# Patient Record
Sex: Female | Born: 1950 | Race: White | Hispanic: No | Marital: Married | Smoking: Former smoker
Health system: Southern US, Community
[De-identification: ages and names within clinical notes are randomized; demographics above are authoritative.]

## PROBLEM LIST (undated history)

## (undated) DIAGNOSIS — I499 Cardiac arrhythmia, unspecified: Secondary | ICD-10-CM

## (undated) DIAGNOSIS — I471 Supraventricular tachycardia: Secondary | ICD-10-CM

## (undated) DIAGNOSIS — J349 Unspecified disorder of nose and nasal sinuses: Secondary | ICD-10-CM

## (undated) DIAGNOSIS — R000 Tachycardia, unspecified: Secondary | ICD-10-CM

## (undated) DIAGNOSIS — I341 Nonrheumatic mitral (valve) prolapse: Secondary | ICD-10-CM

## (undated) DIAGNOSIS — I5189 Other ill-defined heart diseases: Secondary | ICD-10-CM

## (undated) DIAGNOSIS — G8929 Other chronic pain: Secondary | ICD-10-CM

## (undated) DIAGNOSIS — I4719 Other supraventricular tachycardia: Secondary | ICD-10-CM

## (undated) DIAGNOSIS — R51 Headache: Secondary | ICD-10-CM

## (undated) DIAGNOSIS — I493 Ventricular premature depolarization: Secondary | ICD-10-CM

## (undated) DIAGNOSIS — E7800 Pure hypercholesterolemia, unspecified: Secondary | ICD-10-CM

## (undated) HISTORY — DX: Ventricular premature depolarization: I49.3

## (undated) HISTORY — DX: Tachycardia, unspecified: R00.0

## (undated) HISTORY — PX: CARDIAC CATHETERIZATION: SHX172

## (undated) HISTORY — DX: Other ill-defined heart diseases: I51.89

## (undated) HISTORY — DX: Nonrheumatic mitral (valve) prolapse: I34.1

## (undated) HISTORY — DX: Pure hypercholesterolemia, unspecified: E78.00

## (undated) HISTORY — DX: Headache: R51

## (undated) HISTORY — DX: Other chronic pain: G89.29

## (undated) HISTORY — PX: BREAST SURGERY: SHX581

## (undated) HISTORY — DX: Other supraventricular tachycardia: I47.19

## (undated) HISTORY — DX: Cardiac arrhythmia, unspecified: I49.9

## (undated) HISTORY — DX: Unspecified disorder of nose and nasal sinuses: J34.9

## (undated) HISTORY — PX: CHOLECYSTECTOMY: SHX55

## (undated) HISTORY — DX: Supraventricular tachycardia: I47.1

---
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
## 1969-11-14 HISTORY — PX: BREAST EXCISIONAL BIOPSY: SUR124

## 1999-09-07 ENCOUNTER — Encounter: Payer: Self-pay | Admitting: Obstetrics and Gynecology

## 1999-09-07 ENCOUNTER — Encounter: Admission: RE | Admit: 1999-09-07 | Discharge: 1999-09-07 | Payer: Self-pay | Admitting: Obstetrics and Gynecology

## 1999-09-09 ENCOUNTER — Ambulatory Visit (HOSPITAL_COMMUNITY): Admission: RE | Admit: 1999-09-09 | Discharge: 1999-09-09 | Payer: Self-pay | Admitting: Gastroenterology

## 1999-09-22 ENCOUNTER — Encounter: Payer: Self-pay | Admitting: Gastroenterology

## 1999-09-22 ENCOUNTER — Ambulatory Visit (HOSPITAL_COMMUNITY): Admission: RE | Admit: 1999-09-22 | Discharge: 1999-09-22 | Payer: Self-pay | Admitting: Gastroenterology

## 2000-10-11 ENCOUNTER — Encounter: Payer: Self-pay | Admitting: Family Medicine

## 2000-10-11 ENCOUNTER — Encounter: Admission: RE | Admit: 2000-10-11 | Discharge: 2000-10-11 | Payer: Self-pay | Admitting: Family Medicine

## 2000-10-26 ENCOUNTER — Other Ambulatory Visit: Admission: RE | Admit: 2000-10-26 | Discharge: 2000-10-26 | Payer: Self-pay | Admitting: Obstetrics and Gynecology

## 2001-10-26 ENCOUNTER — Other Ambulatory Visit: Admission: RE | Admit: 2001-10-26 | Discharge: 2001-10-26 | Payer: Self-pay | Admitting: Obstetrics and Gynecology

## 2001-10-31 ENCOUNTER — Encounter: Payer: Self-pay | Admitting: Family Medicine

## 2001-10-31 ENCOUNTER — Encounter: Admission: RE | Admit: 2001-10-31 | Discharge: 2001-10-31 | Payer: Self-pay | Admitting: Family Medicine

## 2002-02-15 ENCOUNTER — Encounter: Admission: RE | Admit: 2002-02-15 | Discharge: 2002-02-15 | Payer: Self-pay | Admitting: Family Medicine

## 2002-02-15 ENCOUNTER — Encounter: Payer: Self-pay | Admitting: Family Medicine

## 2003-03-04 ENCOUNTER — Other Ambulatory Visit: Admission: RE | Admit: 2003-03-04 | Discharge: 2003-03-04 | Payer: Self-pay | Admitting: Obstetrics and Gynecology

## 2003-03-26 ENCOUNTER — Encounter: Admission: RE | Admit: 2003-03-26 | Discharge: 2003-03-26 | Payer: Self-pay | Admitting: Family Medicine

## 2003-03-26 ENCOUNTER — Encounter: Payer: Self-pay | Admitting: Family Medicine

## 2004-03-08 ENCOUNTER — Other Ambulatory Visit: Admission: RE | Admit: 2004-03-08 | Discharge: 2004-03-08 | Payer: Self-pay | Admitting: Obstetrics and Gynecology

## 2004-03-31 ENCOUNTER — Encounter: Admission: RE | Admit: 2004-03-31 | Discharge: 2004-03-31 | Payer: Self-pay | Admitting: Obstetrics and Gynecology

## 2005-03-23 ENCOUNTER — Other Ambulatory Visit: Admission: RE | Admit: 2005-03-23 | Discharge: 2005-03-23 | Payer: Self-pay | Admitting: Obstetrics and Gynecology

## 2005-06-10 ENCOUNTER — Encounter: Admission: RE | Admit: 2005-06-10 | Discharge: 2005-06-10 | Payer: Self-pay | Admitting: Obstetrics and Gynecology

## 2006-01-24 ENCOUNTER — Inpatient Hospital Stay (HOSPITAL_COMMUNITY): Admission: EM | Admit: 2006-01-24 | Discharge: 2006-01-26 | Payer: Self-pay | Admitting: Emergency Medicine

## 2006-01-25 ENCOUNTER — Encounter (INDEPENDENT_AMBULATORY_CARE_PROVIDER_SITE_OTHER): Payer: Self-pay | Admitting: *Deleted

## 2006-02-16 ENCOUNTER — Encounter: Admission: RE | Admit: 2006-02-16 | Discharge: 2006-02-16 | Payer: Self-pay | Admitting: Cardiology

## 2006-03-16 ENCOUNTER — Encounter: Admission: RE | Admit: 2006-03-16 | Discharge: 2006-03-16 | Payer: Self-pay | Admitting: Pediatric Nephrology

## 2006-03-29 ENCOUNTER — Encounter (INDEPENDENT_AMBULATORY_CARE_PROVIDER_SITE_OTHER): Payer: Self-pay | Admitting: Specialist

## 2006-03-29 ENCOUNTER — Other Ambulatory Visit: Admission: RE | Admit: 2006-03-29 | Discharge: 2006-03-29 | Payer: Self-pay | Admitting: Interventional Radiology

## 2006-03-29 ENCOUNTER — Encounter: Admission: RE | Admit: 2006-03-29 | Discharge: 2006-03-29 | Payer: Self-pay | Admitting: *Deleted

## 2006-04-28 ENCOUNTER — Other Ambulatory Visit: Admission: RE | Admit: 2006-04-28 | Discharge: 2006-04-28 | Payer: Self-pay | Admitting: Obstetrics and Gynecology

## 2006-07-24 IMAGING — CT CT CHEST W/ CM
2 of 3 series · 15 of 36 positions shown, 18 images · IV contrast (75CC OMNIPAQUE)
Comparison: [REDACTED] chest x-ray 01/24/06.

CLINICAL DATA: Left upper chest pain 2 weeks.  Cough.  Shortness of breath.  Asthma.
 CHEST CT WITH CONTRAST:
TECHNIQUE: Multidetector CT imaging of the chest was performed following the standard protocol during bolus administration of intravenous contrast.
 Contrast:  75 cc Omnipaque 300

[Series 2: — · axial · 0.70mm/px · z∈[-288,-8]mm · 12 of 66 slices shown, 15 images]
[im 5/66  mediastinal]
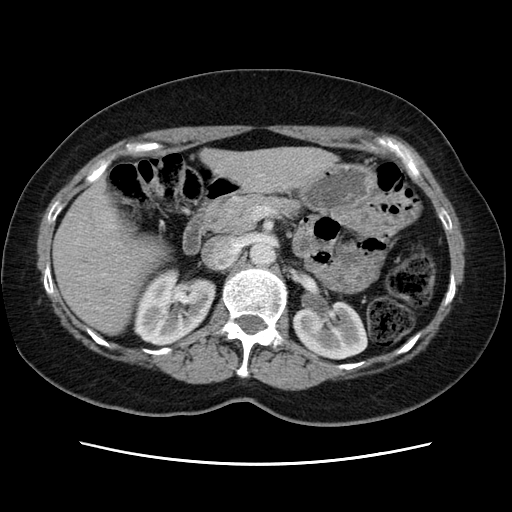
[im 5/66  lung]
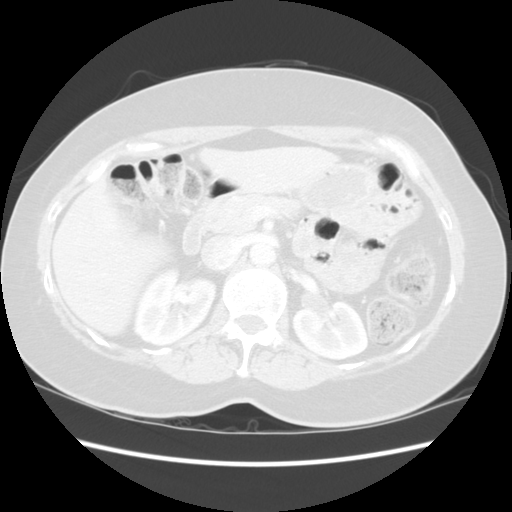
[im 10/66  lung]
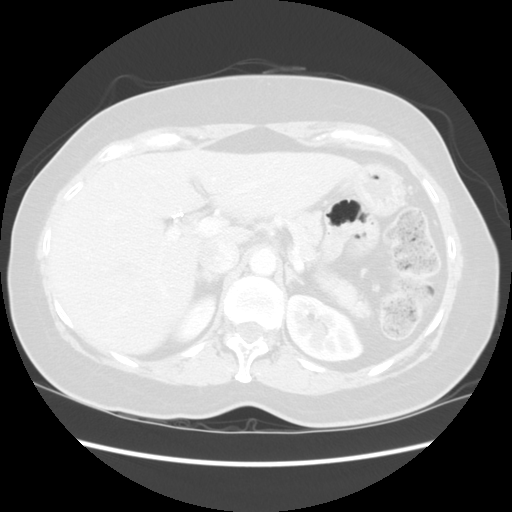
[im 15/66  lung]
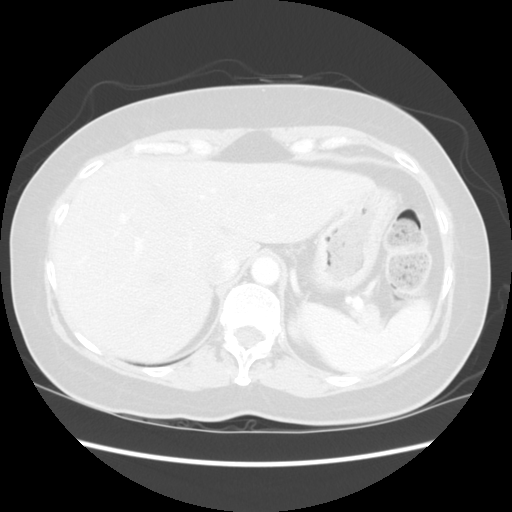
[im 20/66  lung]
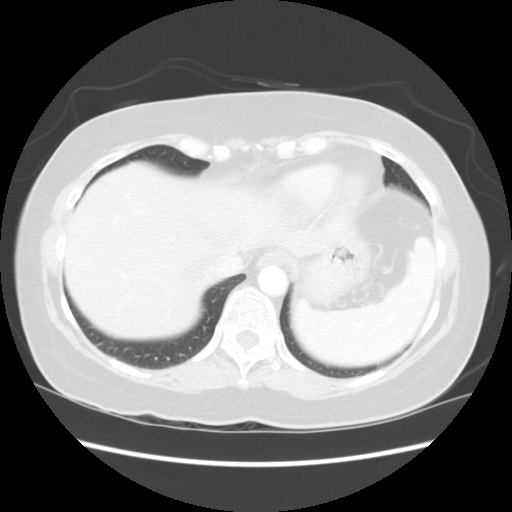
[im 25/66  mediastinal]
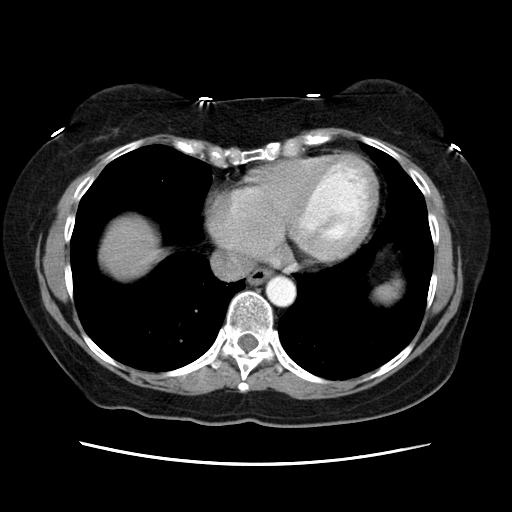
[im 25/66  lung]
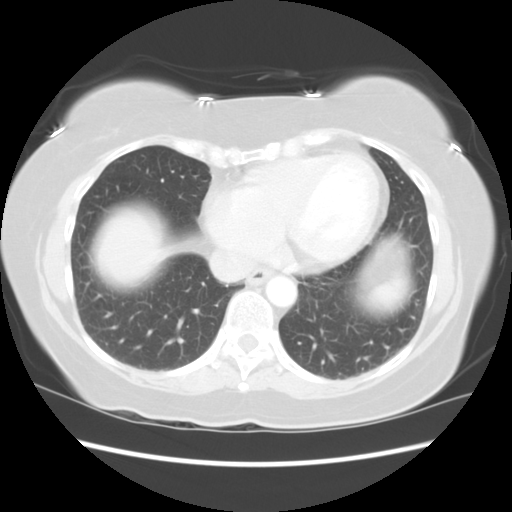
[im 29/66  lung]
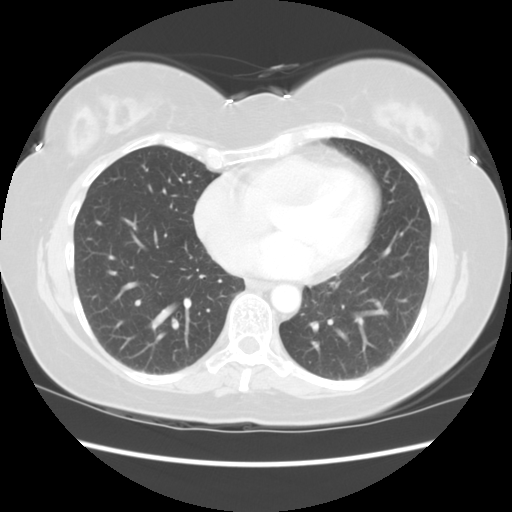
[im 37/66  lung]
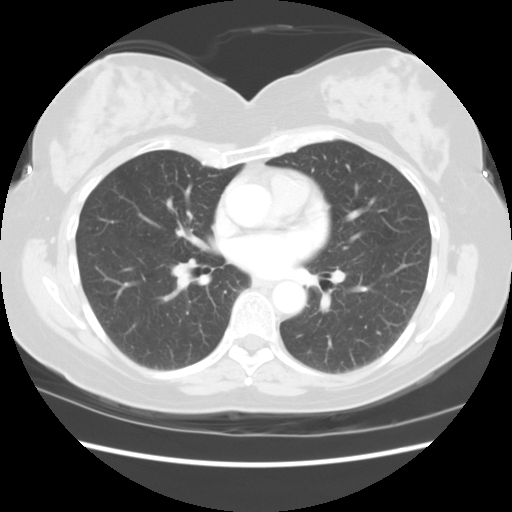
[im 41/66  lung]
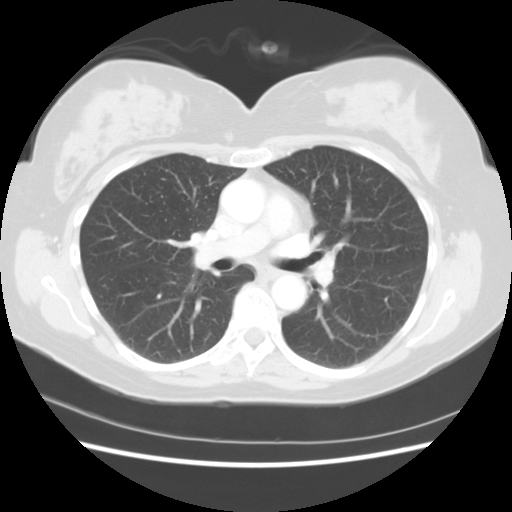
[im 46/66  mediastinal]
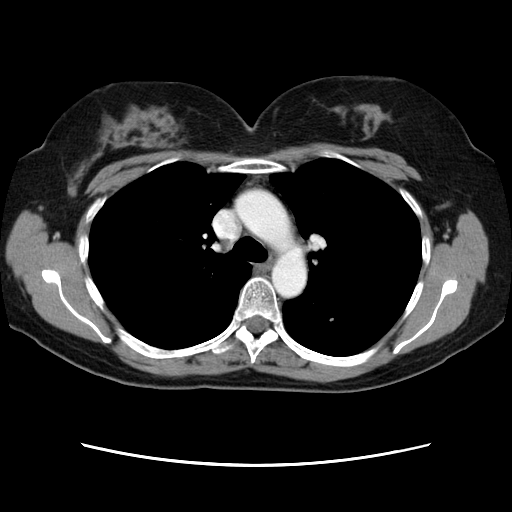
[im 46/66  lung]
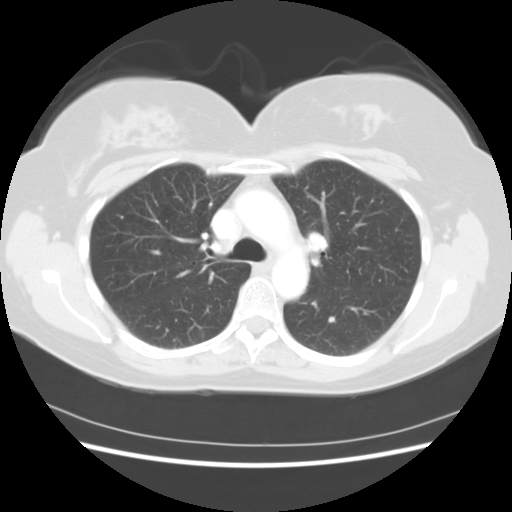
[im 51/66  lung]
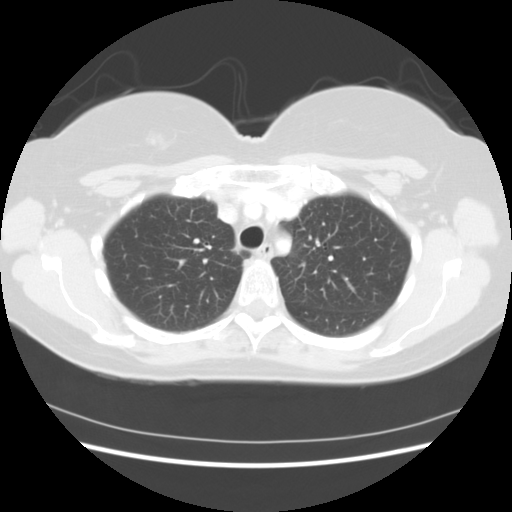
[im 56/66  lung]
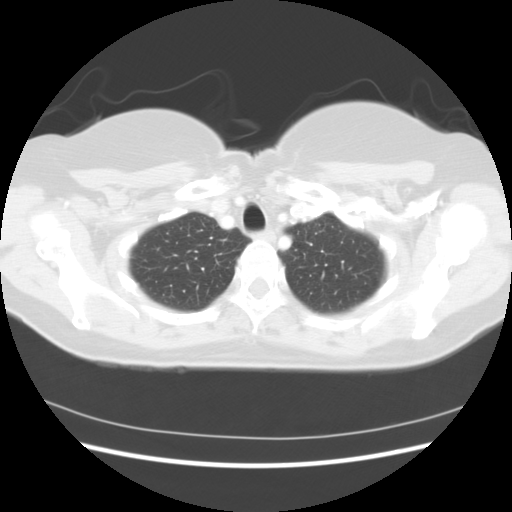
[im 61/66  lung]
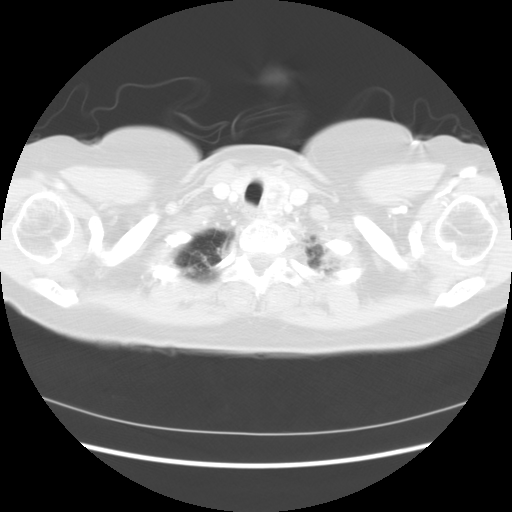

[Series 951: reformatted · coronal · 0.70mm/px · 3 of 75 slices shown]
[im 15/75  lung]
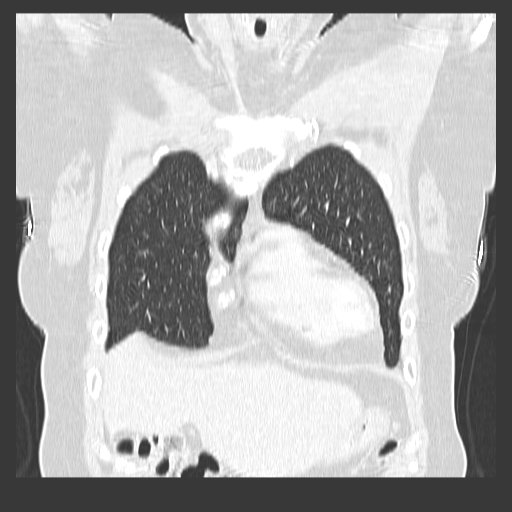
[im 30/75  lung]
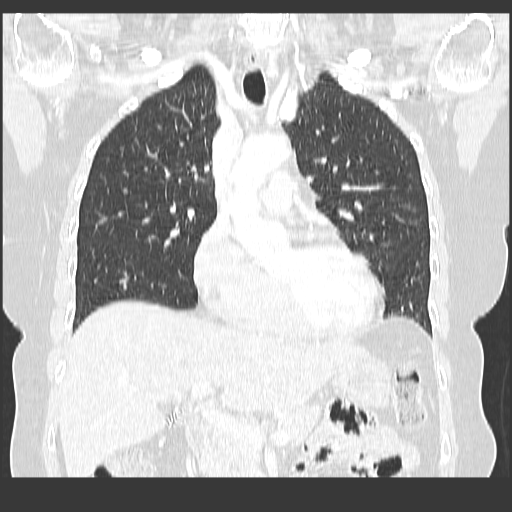
[im 45/75  lung]
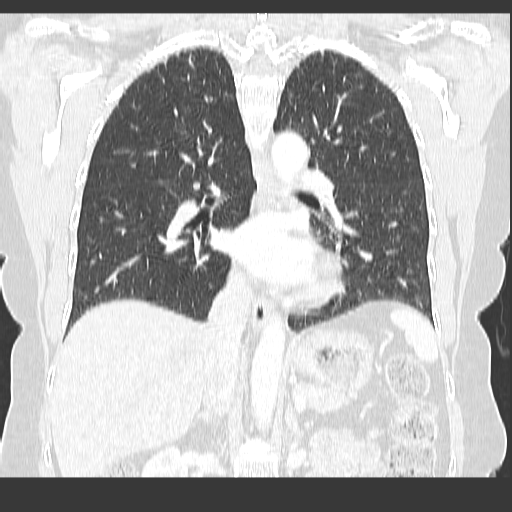

[15 of 36 positions shown; findings below may reference images not displayed]

FINDINGS: 4 mm benign appearing calcified granuloma is seen at the anterior left lung base (image 45).  The lungs are otherwise clear.  No mediastinal, hilar, nor axillary mass/adenopathy is seen.  Slight enlargement left lobe of the thyroid with central heterogeneous lower density nodule measures 1.8 cm x 1.6 cm wide (image 8) ? likely asymmetric multinodular goiter/adenomatous change.  Dome of liver anteriorly 1 cm likely benign incidental cyst.  Indeterminate 7 mm low density focus is seen at the inferior right lobe liver (image 64) with the liver otherwise unremarkable.  Patient is postcholecystectomy with no dilated biliary tract.  Upper abdominal organs are otherwise unremarkable.  Heart size appears normal.
IMPRESSION: 1.  Probable asymmetric goiter with left thyroid adenomatous change. 
 2.  Incidental likely cyst dome of liver and indeterminate yet likely benign 7 mm focus inferior right lobe liver ? targeted ultrasound may be contributory if clinically indicated for further assessment of these foci. 
 3.  Postcholecystectomy.
 4.  Calcified granuloma left lung base. 
 5.  Otherwise negative.

## 2007-03-07 ENCOUNTER — Encounter: Admission: RE | Admit: 2007-03-07 | Discharge: 2007-03-07 | Payer: Self-pay | Admitting: Obstetrics and Gynecology

## 2008-04-30 ENCOUNTER — Encounter: Admission: RE | Admit: 2008-04-30 | Discharge: 2008-04-30 | Payer: Self-pay | Admitting: Obstetrics and Gynecology

## 2009-07-17 ENCOUNTER — Encounter: Admission: RE | Admit: 2009-07-17 | Discharge: 2009-07-17 | Payer: Self-pay | Admitting: Obstetrics and Gynecology

## 2010-08-05 ENCOUNTER — Encounter: Admission: RE | Admit: 2010-08-05 | Discharge: 2010-08-05 | Payer: Self-pay | Admitting: Obstetrics and Gynecology

## 2010-10-27 ENCOUNTER — Ambulatory Visit: Payer: Self-pay | Admitting: Internal Medicine

## 2010-12-05 ENCOUNTER — Encounter: Payer: Self-pay | Admitting: Obstetrics and Gynecology

## 2010-12-05 ENCOUNTER — Encounter: Payer: Self-pay | Admitting: Family Medicine

## 2011-04-01 NOTE — H&P (Signed)
NAME:  Brandi Miller, Brandi Miller NO.:  [id number]   MEDICAL RECORD NO.:  [id number]          PATIENT TYPE:  INP   LOCATION:  6522                         FACILITY:  [facility]   PHYSICIAN:  Francisca December, M.D.  DATE OF BIRTH:  08-02-1951   DATE OF ADMISSION:  01/24/2006  DATE OF DISCHARGE:                                HISTORY & PHYSICAL   CARDIOLOGIST:  Francisca December, M.D. in Armanda Magic, M.D.'s absence.   PRIMARY CARE PHYSICIAN:  Dr. Janey Greaser.   CHIEF COMPLAINT:  Chest tightness.   HISTORY OF PRESENT ILLNESS:  Brandi Miller is a 60 year old Caucasian  female with a chief complaint of a sudden onset of palpitations this  morning, and also with chest, bilateral neck, and left arm tightness.  The  duration was 3 hours.  The patient experienced shortness of breath.  She has  a history of asthma and used her inhaler x1 without relief.  She complained  of weakness with the fear of her legs giving out.  She also experienced  nausea for approximately 30 seconds without vomiting.  She complaints of a  headache; however, she denied dizziness or syncope.  She has a positive  history of the onset of these type of episodes along with chest and left arm  tightness; however, with a much shorter duration, typically leading  duration.  She went to her primary care physician's office this afternoon  with spontaneous resolution of her symptoms.  While at her primary care  physician's office, she was given aspirin 81 mg x1.  An EKG was obtained and  revealed normal sinus rhythm with a ventricular rate of 65 beats per minute.  She arrived to the [facility] emergency department by private car.  She was  accompanied by her husband.  Upon arrival to the [facility] emergency  department, an EKG was obtained and revealed normal sinus rhythm with a  ventricular rate of 64 beats per minute.  The results were consistent with  no only the EKG that was obtained at her primary care physician's  office,  but prior EKGs that have been obtained at [facility].  Point of care  cardiac enzymes x1 are pending, as well as serial cardiac enzymes.  The  patient has a history of mild mitral valve prolapse via a 2-D echocardiogram  in September 2004.  She had a negative stress Cardiolite in May 2005.   PAST MEDICAL HISTORY:  1.  Hypothyroidism.  2.  Asthma.  3.  Atrial tachycardia with occasional PAC's.  4.  Mild MR with flap coaptation of the anterior MV leaflet, on SBE      prophylaxis.  5.  Lactose intolerance.  6.  Mitral valve prolapse via 2-D echocardiogram in September of 2004.  7.  History of H. pylori bacteria.  8.  Fibromyalgia.  9.  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   ALLERGIES:  BETA BLOCKER intolerance.   MEDICATIONS:  1.  Verapamil 240 mg daily.  2.  Singulair 10 mg daily.  3.  Armor thyroid 45 mcg daily.  4.  Activella 1 mg/0.5 mg one-half tablet  daily.  5.  Multivitamin daily.   PAST SURGICAL HISTORY:  1.  Status post tonsillectomy in 1953.  2.  Breast biopsy in 1969 and 1972.  3.  Laparoscopy x4 in 1976, 1978, 1980 and 1989.  4.  Removal of endometrial implant in 1982.  5.  Cholecystectomy in 1992.   FAMILY HISTORY:  1.  Father with MI at age 24, hypertension and polyarthritis nodosa.  2.  Mother deceased at age 36, non-Hodgkin's lymphoma.  3.  Brother deceased at age 80, MI and hypertension.  4.  Sister with hypertension.   SOCIAL HISTORY:  Married with 2 children.  Former smoker with cessation 13  years ago. The patient denies alcohol or illicit drug use.   REVIEW OF SYSTEMS:  Positive for palpitations, chest, bilateral neck and  left arm tightness, weakness, nausea and shortness of breath.  The patient  denies vomiting, diaphoresis, dizziness or syncope.   PHYSICAL EXAMINATION:  GENERAL:  A 60 year old female, pleasant and  cooperative.  NAD.  VITAL SIGNS:  Temperature of 98.0, pulse 66, respirations 18, blood pressure  125/67, O2  saturation 98% on room air.  HEENT:  Unremarkable.  NECK:  Supple without JVD or carotid bruits bilaterally.  LUNGS:  Breath sounds are equal and clear to auscultation bilaterally.  HEART:  Regular rate and rhythm.  S1 and S2 normal without murmurs, gallops,  or rubs.  ABDOMEN:  Soft, nontender, nondistended, with active bowel sounds.  No  aortic or iliac bruits.  No hepatomegaly or masses.  EXTREMITIES:  No peripheral edema.  DP and PT pulses 2+/2.  SKIN:  Warm and dry without rashes or lesions.  NEUROLOGIC:  Alert and oriented x3.  No focal deficits.  PSYCHIATRIC:  Normal mood and affect.   LABORATORY DATA:  The following labs are pending - CMP, CBC with  differential, PT, INR, PTT, magnesium, TSH and chest x-ray (2 view).  CK  total, CK-MB and troponin I now and q.8h. x2.   EKG:  Normal sinus rhythm with a ventricular rate of 64 beats per minute.   ASSESSMENT:  1.  Palpitations.  Questionable atrial tachycardia versus thyroid.  2.  Chest, bilateral neck and left arm tightness.  Questionable UAP.  3.  Dyspnea.  4.  Mitral valve prolapse.  5.  Hypothyroidism.  6.  Asthma.   PLAN:  1.  Admit to cardiac telemetry unit with a diagnosis of chest tightness.      Rule out MI protocol.  Cardiologist - Dr. Corliss Marcus.  2.  Diet - Prudent diet.  No caffeine.  3.  NPO after midnight tonight, January 24, 2006, except for medications.  4.  Start aspirin 325 mg daily now.  5.  Bed rest.  Out of bed to bathroom.  6.  Initiate cardiac PRN orders.  7.  Dr. Mayford Knife to decide further evaluation.  (?) consider catheterization,      as chest pain is recurrent, and negative Cardiolites in the past.      Brandi Miller, [place]      Francisca December, M.D.  Electronically Signed    RDM/MEDQ  D:  01/24/2006  T:  01/25/2006  Job:  [id number]   cc:   Dr. Janey Greaser

## 2011-04-01 NOTE — Discharge Summary (Signed)
NAME:  Brandi Miller, Brandi Miller NO.:  [id number]   MEDICAL RECORD NO.:  [id number]          PATIENT TYPE:  INP   LOCATION:  6522                         FACILITY:  [facility]   PHYSICIAN:  Corky Crafts, MDDATE OF BIRTH:  [birth year]   DATE OF ADMISSION:  01/24/2006  DATE OF DISCHARGE:  01/26/2006                                 DISCHARGE SUMMARY   DISCHARGE DIAGNOSIS:  1.  Chest pain, resolved.  2.  Palpitations, resolved.  3.  Dyspnea, resolved.  4.  Hypothyroidism, treated.  5.  Asthma, treated.  6.  Mitral valve prolapse.  7.  Post menopausal.   HOSPITAL COURSE:  Brandi Miller is a 60 year old female who is admitted to  [facility] on January 24, 2006, with complaint of palpitations  associated with chest pain, neck pain, and left arm tightness.  On the day  of admission, the duration of her symptoms lasted approximately three hours.  She proceeded to the emergency room and her EKG was essentially normal.  Her  workup included serial cardiac isoenzymes which were normal.  She remained  on the telemetry unit and telemetry did show several short bursts of SVT  that lasted only a few seconds each.  The patient was asymptomatic.  Normally, the patient's heart rate ranged between 55 and 65 beats per  minute.  A 2D echo was performed and showed a normal ejection fraction with  no wall motion abnormalities.  The echo did show mild MR with minimal mitral  valve prolapse.  Because of the patient's symptoms, we proceeded with  cardiac catheterization on January 26, 2006.  The cardiac catheterization was  performed by Dr. Eldridge Dace and revealed no significant coronary artery  disease, normal LV function, normal hemodynamics.  The patient was  subsequently ready for discharge home on January 26, 2006.  She did tell me,  however, that at home she was taking her Verapamil 240 mg every other day  and on the odd days, cutting the pill in half.  She thought that she was  bruising  from the Verapamil.  I did re-emphasize the need to take the  Verapamil 240 mg daily and this maybe why she had the significant episode of  palpitations on the day of admission.   LABORATORY DATA:  During her hospital stay include normal cardiac  isoenzymes.  Hemoglobin 12.7, hematocrit 36.8, platelets 310.  TSH 1.061.  Sodium 142, potassium 3.5, BUN 10, creatinine 0.8.  Normal LFTs.   DISCHARGE MEDICATIONS:  Verapamil 240 mg daily, Singular 1 tablet daily,  thyroid pill daily, multi-vitamin daily.   FOLLOW UP:  She is to follow up with Dr. Janey Greaser if needed.  She is to  return to see Dr. Mayford Knife for re-evaluation of the palpitations on February 09, 2006, at 12:45 p.m.  She is to call for recurrent palpitations prior to that  visit.  She may not drive for two days, no lifting over 10 pounds for one  week, and can increase her activities slowly.  She is to clean the cath site  gently with soap and water.  Guy Franco, P.A.      Corky Crafts, MD  Electronically Signed    LB/MEDQ  D:  01/26/2006  T:  01/27/2006  Job:  [id number]   cc:   Dr. Janey Greaser   C. Duane Lope, M.D.  Fax: [phone]   Armanda Magic, M.D.  Fax: [phone]

## 2011-05-19 ENCOUNTER — Telehealth: Payer: Self-pay | Admitting: Internal Medicine

## 2011-05-19 NOTE — Telephone Encounter (Signed)
Called, spoke with pt.  She is a former pt of CDY's from [facility].  She c/o trouble breathing mainly when she is up moving around.  States this has been doing on x 6 wks.  She has been seen by PCP for this and was given an inhaler, which she cannot remember the name of, and prednisone x 5 days.  Pt states breathing improved while she was on the prednisone but since coming off of it, breathing has started to worsen again.  She is requesting to see CDY ASAP.  I advised bc it has been > 3 yrs since seen by CDY she is considered a new pt.  We scheduled first available consult with CDY for Aug 1 at 10am.  Pt aware to arrive at 9:45am, bring all current meds, and recent insurance card if she has one.  She was also instructed in the meantime she will need to call her PCP or go to urgent care/emergency department for these problems.  She verbalized understanding of this.  In the meantime, she would like to know if CDY could work her in prior to Aug 1 -- pls advise.  Thanks!

## 2011-05-19 NOTE — Telephone Encounter (Signed)
Just in case a routine space opens up.

## 2011-05-20 NOTE — Telephone Encounter (Signed)
error 

## 2011-05-23 NOTE — Telephone Encounter (Signed)
No sooner appt open at this time.

## 2011-05-23 NOTE — Telephone Encounter (Signed)
[email or web address].Jennifer Castillo, CMA  

## 2011-05-24 NOTE — Telephone Encounter (Signed)
[email or web address]  

## 2011-05-25 NOTE — Telephone Encounter (Signed)
Pt says she is doing better and will see CDY on 8/1 @ 10 am.

## 2011-06-15 ENCOUNTER — Ambulatory Visit (INDEPENDENT_AMBULATORY_CARE_PROVIDER_SITE_OTHER): Payer: BC Managed Care – PPO | Admitting: Internal Medicine

## 2011-06-15 ENCOUNTER — Encounter: Payer: Self-pay | Admitting: Internal Medicine

## 2011-06-15 VITALS — BP 122/66 | HR 57 | Ht 66.5 in | Wt 192.4 lb

## 2011-06-15 DIAGNOSIS — J309 Allergic rhinitis, unspecified: Secondary | ICD-10-CM

## 2011-06-15 DIAGNOSIS — J45909 Unspecified asthma, uncomplicated: Secondary | ICD-10-CM

## 2011-06-15 DIAGNOSIS — J4520 Mild intermittent asthma, uncomplicated: Secondary | ICD-10-CM | POA: Insufficient documentation

## 2011-06-15 NOTE — Patient Instructions (Signed)
Sample Spiriva- one daily Sample Qvar 40  - 2 puffs and rinse mouth well, once daily  Order- Schedule PFT             a1AT screen

## 2011-06-15 NOTE — Progress Notes (Signed)
Subjective:    Patient ID: Brandi Miller, female    DOB: [birth year], 60 y.o.   MRN: [id number]  HPI 06/15/11- This is a 60 year old former smoker seen on kind referral by Dr. Duane Lope for complaint of difficulty breathing. She had been seen in my old practice 12 years ago with mild obstructive airways disease on spirometry and positive allergy skin testing at that time. She carried a diagnosis of asthma and allergic rhinitis. She now reports some difficulty breathing for 20 years. Triggers include pressure treated wood, cleansers and bleach, strong fragrances, wood smoke, newspaper, new carpet and other strong odors. 2 months ago she was exposed to fragrance and new carpet. For the next 6 weeks she had increased shortness of breath with cough chest tightness shallow breathing but no wheeze. She was seen in her primary office and treated with an albuterol inhaler and a short course of prednisone which helped. She was comfortable on vacation while hiking. By the time she returned to this area, new carpet in her home had aired out and she felt more comfortable. She is aware of mild indigestion. Has had mild allergic rhinitis which comes and goes. Chest x-ray was clear 3 weeks ago at her primary office. 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 She did not require chest tube placement. She had smoked for 22 years at one pack per day, quitting 20 years ago. There is a strong family history of heart disease but not lung disease.  Review of Systems Constitutional:   No-   weight loss, night sweats, fevers, chills, fatigue, lassitude. HEENT:   No-   headaches, difficulty swallowing, tooth/dental problems, sore throat,                 Seasonal -  sneezing, itching, ear ache, nasal congestion, post nasal drip,   CV:  No-   chest pain, orthopnea, PND, swelling in lower extremities, anasarca, dizziness, palpitations  GI:  No-    heartburn, indigestion, abdominal pain, nausea, vomiting, diarrhea,                 change in bowel habits, loss of appetite  Resp: .  No-  excess mucus,             No-   productive cough,  No non-productive cough,  No-  coughing up of blood.              No-   change in color of mucus.  No- wheezing.    Skin: No-   rash or lesions.  GU: No-   dysuria, change in color of urine, no urgency or frequency.  No- flank pain.  MS:  No-   joint pain or swelling.  No- decreased range of motion.  No- back pain.  Psych:  No- change in mood or affect. No depression or anxiety.  No memory loss.      Objective:   Physical Exam General- Alert, Oriented, Affect-appropriate, Distress- none acute Skin- rash-none, lesions- none, excoriation- none Lymphadenopathy- none Head- atraumatic            Eyes- Gross vision intact, PERRLA, conjunctivae clear secretions            Ears- Hearing, canals normal            Nose- Clear, no-Septal dev, mucus, polyps, erosion, perforation             Throat-  Mallampati II ,+ mucosa - red and glandular ,          + drainage- mucoid post nasal drip,     tonsils- atrophic Neck- flexible , trachea midline, no stridor , thyroid nl, carotid no bruit Chest - symmetrical excursion , unlabored           Heart/CV- RRR , no murmur , no gallop  , no rub, nl s1 s2                           - JVD- none , edema- none, stasis changes- none, varices- none           Lung- clear to P&A, wheeze- none, cough- none , dullness-none, rub- none           Chest wall-  Abd- tender-no, distended-no, bowel sounds-present, HSM- no Br/ Gen/ Rectal- Not done, not indicated Extrem- cyanosis- none, clubbing, none, atrophy- none, strength- nl Neuro- grossly intact to observation         Assessment & Plan:

## 2011-06-15 NOTE — Assessment & Plan Note (Addendum)
We may not be able to tell what works unless she is symptomatic. Interested in red throat and may need to come back to rhinitis and GERD issues. For now I will have her re-try Qvar while adding Spiriva sample. Order PFT and a1AT screen. Consider methacholine challenge Remote history of spontaneous pneumothorax might be associated with congenital blebs but these have not been reported from prior chest x-ray. I don't think we need a CT scan at this time.

## 2011-06-18 DIAGNOSIS — J309 Allergic rhinitis, unspecified: Secondary | ICD-10-CM | POA: Insufficient documentation

## 2011-06-18 NOTE — Assessment & Plan Note (Signed)
There is a history of documented allergic rhinitis with positive allergy skin tests. Some of her postnasal drainage may be a nonallergic rhinitis and at least in some seasons allergy is likely important.

## 2011-07-12 ENCOUNTER — Encounter: Payer: Self-pay | Admitting: Internal Medicine

## 2011-07-26 ENCOUNTER — Ambulatory Visit (INDEPENDENT_AMBULATORY_CARE_PROVIDER_SITE_OTHER): Payer: BC Managed Care – PPO | Admitting: Internal Medicine

## 2011-07-26 ENCOUNTER — Encounter: Payer: Self-pay | Admitting: Internal Medicine

## 2011-07-26 VITALS — BP 128/72 | HR 76 | Ht 66.5 in | Wt 191.6 lb

## 2011-07-26 DIAGNOSIS — Z23 Encounter for immunization: Secondary | ICD-10-CM

## 2011-07-26 DIAGNOSIS — J4520 Mild intermittent asthma, uncomplicated: Secondary | ICD-10-CM

## 2011-07-26 DIAGNOSIS — J309 Allergic rhinitis, unspecified: Secondary | ICD-10-CM

## 2011-07-26 DIAGNOSIS — J45909 Unspecified asthma, uncomplicated: Secondary | ICD-10-CM

## 2011-07-26 LAB — PULMONARY FUNCTION TEST

## 2011-07-26 MED ORDER — FLUTICASONE-SALMETEROL 100-50 MCG/DOSE IN AEPB: 1.0000 | INHALATION_SPRAY | Freq: Two times a day (BID) | RESPIRATORY_TRACT | Status: DC

## 2011-07-26 NOTE — Assessment & Plan Note (Addendum)
Mild asthma with reversible small airway obstruction We discussed asthma and the role of her meds.  Use rescue inhaler as needed Change Qvar to Advair trial.

## 2011-07-26 NOTE — Patient Instructions (Signed)
Don't forget your flu shot this fall  Sample x 2 and script for Advair 100    1 puff and rinse mouth twice every day  You can still use the Ventolin rescue inhaler if needed

## 2011-07-26 NOTE — Progress Notes (Signed)
Subjective:    Patient ID: Brandi Miller, female    DOB: Jul 30, 1951, 60 y.o.   MRN: [id number]  Asthma Her past medical history is significant for asthma.   06/15/11- This is a 60 year old former smoker seen on kind referral by Dr. Duane Lope for complaint of difficulty breathing. She had been seen in my old practice 12 years ago with mild obstructive airways disease on spirometry and positive allergy skin testing at that time. She carried a diagnosis of asthma and allergic rhinitis. She now reports some difficulty breathing for 20 years. Triggers include pressure treated wood, cleansers and bleach, strong fragrances, wood smoke, newspaper, new carpet and other strong odors. 2 months ago she was exposed to fragrance and new carpet. For the next 6 weeks she had increased shortness of breath with cough chest tightness shallow breathing but no wheeze. She was seen in her primary office and treated with an albuterol inhaler and a short course of prednisone which helped. She was comfortable on vacation while hiking. By the time she returned to this area, new carpet in her home had aired out and she felt more comfortable. She is aware of mild indigestion. Has had mild allergic rhinitis which comes and goes. Chest x-ray was clear 3 weeks ago at her primary office. 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 She did not require chest tube placement. She had smoked for 22 years at one pack per day, quitting 20 years ago. There is a strong family history of heart disease but not lung disease.  07/26/11-  This is a 60 year old former smoker seen on kind referral by Dr. Duane Lope for complaint of difficulty breathing Here for review of PFT. Jittery from the albuterol, but no other effect on how she feels. Has been out of town and in and out of home with new carpet. Yesterday at home had more of the sense of chest tightness and tiredness  with laryngitis w/o postnasal drip. She took albuterol and lay down in her bedroom where there is Radiation protection practitioner. Today is better.  Continues Qvar. Lost the sample of spiriva.  PFT- 07/26/11- mild reversible small airway obstruction.   Review of Systems Constitutional:   No-   weight loss, night sweats, fevers, chills, fatigue, lassitude. HEENT:   No-   headaches, difficulty swallowing, tooth/dental problems, sore throat,                 Seasonal -  sneezing, itching, ear ache, nasal congestion, post nasal drip,  CV:  No-   chest pain, orthopnea, PND, swelling in lower extremities, anasarca, dizziness, palpitations GI:  No-   heartburn, indigestion, abdominal pain, nausea, vomiting, diarrhea,                 change in bowel habits, loss of appetite Resp: .  No-  excess mucus,             No-   productive cough,  No non-productive cough,  No-  coughing up of blood.              No-   change in color of mucus.  No- wheezing.   Skin: No-   rash or lesions. GU: No-   dysuria, change in color of urine, no urgency or frequency.  No- flank pain. MS:  No-   joint pain or swelling.  No- decreased range of motion.  No- back pain. Psych:  No- change in mood or affect. No depression or anxiety.  No memory loss.      Objective:   Physical Exam  General- Alert, Oriented, Affect-appropriate, Distress- none acute Skin- rash-none, lesions- none, excoriation- none Lymphadenopathy- none Head- atraumatic            Eyes- Gross vision intact, PERRLA, conjunctivae clear secretions            Ears- Hearing, canals normal            Nose- Clear, no-Septal dev, mucus, polyps, erosion, perforation             Throat- Mallampati II ,+ mucosa - red and glandular ,          + drainage- mucoid post nasal drip,     tonsils- atrophic Neck- flexible , trachea midline, no stridor , thyroid nl, carotid no bruit Chest - symmetrical excursion , unlabored           Heart/CV- RRR , no murmur , no gallop  , no rub, nl s1 s2                            - JVD- none , edema- none, stasis changes- none, varices- none           Lung- clear to P&A- ? A little coarse?, wheeze- none, cough- none , dullness-none, rub- none           Chest wall-  Abd- tender-no, distended-no, bowel sounds-present, HSM- no Br/ Gen/ Rectal- Not done, not indicated Extrem- cyanosis- none, clubbing, none, atrophy- none, strength- nl Neuro- grossly intact to observation         Assessment & Plan:

## 2011-07-26 NOTE — Progress Notes (Signed)
PFT done today. 

## 2011-07-28 NOTE — Assessment & Plan Note (Signed)
Mild allergic rhinitis should respond to antihistamines.

## 2011-08-03 ENCOUNTER — Encounter: Payer: Self-pay | Admitting: Internal Medicine

## 2011-09-08 ENCOUNTER — Ambulatory Visit (INDEPENDENT_AMBULATORY_CARE_PROVIDER_SITE_OTHER): Payer: BC Managed Care – PPO | Admitting: Internal Medicine

## 2011-09-08 ENCOUNTER — Encounter: Payer: Self-pay | Admitting: Internal Medicine

## 2011-09-08 VITALS — BP 116/62 | HR 64 | Ht 66.5 in | Wt 194.2 lb

## 2011-09-08 DIAGNOSIS — J4520 Mild intermittent asthma, uncomplicated: Secondary | ICD-10-CM

## 2011-09-08 DIAGNOSIS — J309 Allergic rhinitis, unspecified: Secondary | ICD-10-CM

## 2011-09-08 DIAGNOSIS — J45909 Unspecified asthma, uncomplicated: Secondary | ICD-10-CM

## 2011-09-08 NOTE — Progress Notes (Signed)
Patient ID: Brandi Miller, female    DOB: 09/25/1951, 60 y.o.   MRN: [id number]  Asthma Her past medical history is significant for asthma.   06/15/11- This is a 60 year old former smoker seen on kind referral by Dr. Duane Lope for complaint of difficulty breathing. She had been seen in my old practice 12 years ago with mild obstructive airways disease on spirometry and positive allergy skin testing at that time. She carried a diagnosis of asthma and allergic rhinitis. She now reports some difficulty breathing for 20 years. Triggers include pressure treated wood, cleansers and bleach, strong fragrances, wood smoke, newspaper, new carpet and other strong odors. 2 months ago she was exposed to fragrance and new carpet. For the next 6 weeks she had increased shortness of breath with cough chest tightness shallow breathing but no wheeze. She was seen in her primary office and treated with an albuterol inhaler and a short course of prednisone which helped. She was comfortable on vacation while hiking. By the time she returned to this area, new carpet in her home had aired out and she felt more comfortable. She is aware of mild indigestion. Has had mild allergic rhinitis which comes and goes. Chest x-ray was clear 3 weeks ago at her primary office. 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 She did not require chest tube placement. She had smoked for 22 years at one pack per day, quitting 20 years ago. There is a strong family history of heart disease but not lung disease.  07/26/11-  This is a 60 year old former smoker seen on kind referral by Dr. Duane Lope for complaint of difficulty breathing Here for review of PFT. Jittery from the albuterol, but no other effect on how she feels. Has been out of town and in and out of home with new carpet. Yesterday at home had more of the sense of chest tightness and tiredness with  laryngitis w/o postnasal drip. She took albuterol and lay down in her bedroom where there is Radiation protection practitioner. Today is better.  Continues Qvar. Lost the sample of spiriva.  PFT- 07/26/11- mild reversible small airway obstruction.  09/08/11- 60 year old female former smoker followed for Dyspnea, allergic rhinitis, mild reversible obstruction, remote history of pneumothorax x2 Since last here, Advair has helped and she is definitely doing better. The carpet is slowly airing out. We discussed Advair as a maintenance medication and compared it to rescue inhalers.  Review of Systems Constitutional:   No-   weight loss, night sweats, fevers, chills, fatigue, lassitude. HEENT:   No-   headaches, difficulty swallowing, tooth/dental problems, sore throat,                 Seasonal -  sneezing, itching, ear ache, nasal congestion, post nasal drip,  CV:  No-   chest pain, orthopnea, PND, swelling in lower extremities, anasarca, dizziness, palpitations GI:  No-   heartburn, indigestion, abdominal pain, nausea, vomiting, diarrhea,                 change in bowel habits, loss of appetite Resp: .  No-  excess mucus,             No-   productive cough,  No non-productive cough,  No-  coughing up of blood.              No-   change in color of mucus.  + wheezing.  Skin: No-   rash or lesions. GU: No-   dysuria, change in color of urine, no urgency or frequency.  No- flank pain. MS:  No-   joint pain or swelling.  No- decreased range of motion.  No- back pain. Psych:  No- change in mood or affect. No depression or anxiety.  No memory loss.      Objective:   Physical Exam  General- Alert, Oriented, Affect-appropriate, Distress- none acute         Skin- rash-none, lesions- none, excoriation- none Lymphadenopathy- none Head- atraumatic            Eyes- Gross vision intact, PERRLA, conjunctivae clear secretions            Ears- Hearing, canals -+ cerumen            Nose- Clear, no-Septal dev, mucus, polyps,  erosion, perforation             Throat- Mallampati II ,+ mucosa - red and glandular ,          + drainage- mucoid post nasal drip,     tonsils- atrophic Neck- flexible , trachea midline, no stridor , thyroid nl, carotid no bruit Chest - symmetrical excursion , unlabored           Heart/CV- RRR , no murmur , no gallop  , no rub, nl s1 s2                           - JVD- none , edema- none, stasis changes- none, varices- none           Lung-  , cough- none , dullness-none, rub- none           Chest wall-  Abd- tender-no, distended-no, bowel sounds-present, HSM- no Br/ Gen/ Rectal- Not done, not indicated Extrem- cyanosis- none, clubbing, none, atrophy- none, strength- nl Neuro- grossly intact to observation

## 2011-09-08 NOTE — Patient Instructions (Addendum)
Continue Advair 100-50. You may find that most of the time you can be stable using it just once daily, but be quick to move back up to twice daily at the first sign of trouble coming.  Dr Tenny Craw can refill this for you,

## 2011-09-09 ENCOUNTER — Other Ambulatory Visit: Payer: Self-pay | Admitting: Obstetrics and Gynecology

## 2011-09-09 DIAGNOSIS — Z1231 Encounter for screening mammogram for malignant neoplasm of breast: Secondary | ICD-10-CM

## 2011-09-10 NOTE — Assessment & Plan Note (Addendum)
Mild to moderate intermittent asthma. Good control now using Advair instead of Qvar. Rare need currently for rescue inhaler. She is going to see Dr. Tenny Craw primarily and return to see me as needed.

## 2011-09-26 ENCOUNTER — Ambulatory Visit: Payer: BC Managed Care – PPO

## 2011-10-14 ENCOUNTER — Ambulatory Visit
Admission: RE | Admit: 2011-10-14 | Discharge: 2011-10-14 | Disposition: A | Discharge status: Home / Self Care | Payer: BC Managed Care – PPO | Source: Ambulatory Visit | Attending: Obstetrics and Gynecology | Admitting: Obstetrics and Gynecology

## 2011-10-14 DIAGNOSIS — Z1231 Encounter for screening mammogram for malignant neoplasm of breast: Secondary | ICD-10-CM

## 2012-01-25 ENCOUNTER — Other Ambulatory Visit: Payer: Self-pay | Admitting: Gastroenterology

## 2012-09-17 ENCOUNTER — Other Ambulatory Visit: Payer: Self-pay | Admitting: Obstetrics and Gynecology

## 2012-09-17 DIAGNOSIS — Z1231 Encounter for screening mammogram for malignant neoplasm of breast: Secondary | ICD-10-CM

## 2012-10-15 ENCOUNTER — Ambulatory Visit
Admission: RE | Admit: 2012-10-15 | Discharge: 2012-10-15 | Disposition: A | Discharge status: Home / Self Care | Payer: BC Managed Care – PPO | Source: Ambulatory Visit | Attending: Obstetrics and Gynecology | Admitting: Obstetrics and Gynecology

## 2012-10-15 DIAGNOSIS — Z1231 Encounter for screening mammogram for malignant neoplasm of breast: Secondary | ICD-10-CM

## 2013-09-19 ENCOUNTER — Other Ambulatory Visit: Payer: Self-pay

## 2013-09-19 DIAGNOSIS — Z1231 Encounter for screening mammogram for malignant neoplasm of breast: Secondary | ICD-10-CM

## 2013-10-28 ENCOUNTER — Ambulatory Visit
Admission: RE | Admit: 2013-10-28 | Discharge: 2013-10-28 | Disposition: A | Discharge status: Home / Self Care | Payer: BC Managed Care – PPO | Source: Ambulatory Visit

## 2013-10-28 DIAGNOSIS — Z1231 Encounter for screening mammogram for malignant neoplasm of breast: Secondary | ICD-10-CM

## 2014-05-15 ENCOUNTER — Encounter: Payer: Self-pay | Admitting: Cardiology

## 2014-07-16 ENCOUNTER — Encounter: Payer: Self-pay | Admitting: Cardiology

## 2014-07-16 ENCOUNTER — Ambulatory Visit (INDEPENDENT_AMBULATORY_CARE_PROVIDER_SITE_OTHER): Payer: Medicare Other | Admitting: Cardiology

## 2014-07-16 VITALS — BP 122/68 | HR 63 | Ht 66.0 in | Wt 188.0 lb

## 2014-07-16 DIAGNOSIS — I059 Rheumatic mitral valve disease, unspecified: Secondary | ICD-10-CM

## 2014-07-16 DIAGNOSIS — I4719 Other supraventricular tachycardia: Secondary | ICD-10-CM

## 2014-07-16 DIAGNOSIS — I471 Supraventricular tachycardia: Secondary | ICD-10-CM

## 2014-07-16 DIAGNOSIS — I5189 Other ill-defined heart diseases: Secondary | ICD-10-CM | POA: Insufficient documentation

## 2014-07-16 DIAGNOSIS — I4949 Other premature depolarization: Secondary | ICD-10-CM

## 2014-07-16 DIAGNOSIS — I341 Nonrheumatic mitral (valve) prolapse: Secondary | ICD-10-CM

## 2014-07-16 DIAGNOSIS — R079 Chest pain, unspecified: Secondary | ICD-10-CM

## 2014-07-16 DIAGNOSIS — I493 Ventricular premature depolarization: Secondary | ICD-10-CM

## 2014-07-16 DIAGNOSIS — R0602 Shortness of breath: Secondary | ICD-10-CM

## 2014-07-16 NOTE — Patient Instructions (Addendum)
Your physician recommends that you continue on your current medications as directed. Please refer to the Current Medication list given to you today.  Your physician has requested that you have an echocardiogram. Echocardiography is a painless test that uses sound waves to create images of your heart. It provides your doctor with information about the size and shape of your heart and how well your heart's chambers and valves are working. This procedure takes approximately one hour. There are no restrictions for this procedure.  Your physician has recommended that you wear an event monitor. Event monitors are medical devices that record the heart's electrical activity. Doctors most often [place] these monitors to diagnose arrhythmias. Arrhythmias are problems with the speed or rhythm of the heartbeat. The monitor is a small, portable device. You can wear one while you do your normal daily activities. This is usually used to diagnose what is causing palpitations/syncope (passing out).  Your physician has requested that you have a lexiscan myoview. For further information please visit [email or web address] Please follow instruction sheet, as given.  Your physician wants you to follow-up in: 6 months with dr Sherlyn Lick will receive a reminder letter in the mail two months in advance. If you don't receive a letter, please call our office to schedule the follow-up appointment.

## 2014-07-16 NOTE — Progress Notes (Signed)
[address] [place], [place]  [address] Phone: [phone] Fax:  [phone]  Date:  07/16/2014   ID:  Brandi Miller, DOB Oct 14, 1951, MRN [id number]  PCP:  No primary provider on file.  Cardiologist:  Armanda Magic, MD     History of Present Illness: Brandi Miller is a 63 y.o. 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 She has been having recurrence of palpitations that have been occurring on a daily basis.  She also has been having CP similar to what she had at the time of her stress test in 2011 that was normal but the CP has now increased frequency.  She has also noticed SOB with the chest pressure and with the palpitations.  She has also been having LE edema.  Occasionally when she gets the chest pressure and palpitations she will get dizzy and feel like she is going to pass out.  Wt Readings from Last 3 Encounters:  07/16/14 188 lb (85.276 kg)  09/08/11 194 lb 3.2 oz (88.089 kg)  07/26/11 191 lb 9.6 oz (86.909 kg)     Past Medical History  Diagnosis Date  . Abnormal heart rhythm   . Asthma   . High cholesterol   . Sinus trouble   . Chronic headaches   . Tachycardia   . Mitral prolapse   . MVP (mitral valve prolapse)     mild MR by echo 2011  . Diastolic dysfunction   . Atrial tachycardia, paroxysmal   . PVC (premature ventricular contraction)     Current Outpatient Prescriptions  Medication Sig Dispense Refill  . ARMOUR THYROID 60 MG tablet Take 1.5 tablets by mouth Daily.      Marland Kitchen BYSTOLIC 5 MG tablet Take 2.5 mg by mouth Daily.      . Calcium Carb-Cholecalciferol (CALCIUM 1000 + D PO) Take 1 tablet by mouth daily.        . Coenzyme Q10 (COQ10) 200 MG CAPS Take 1 tablet by mouth every other day.        . diphenhydrAMINE (BENADRYL) 12.5 MG chewable tablet Chew 12.5 mg by mouth at bedtime.        . Multiple Vitamins-Minerals (CENTRUM SILVER) tablet Take 1  tablet by mouth daily.        . VENTOLIN HFA 108 (90 BASE) MCG/ACT inhaler Inhale 2 puffs into the lungs 4 (four) times daily as needed.      . vitamin C (ASCORBIC ACID) 500 MG tablet Take 500 mg by mouth daily.         No current facility-administered medications for this visit.    Allergies:    Allergies  Allergen Reactions  . Niaspan [Niacin Er] Hives and Nausea And Vomiting    Social History:  The patient  reports that she quit smoking about 25 years ago. Her smoking use included Cigarettes. She has a 20 pack-year smoking history. She does not have any smokeless tobacco history on file. She reports that she does not drink alcohol or use illicit drugs.   Family History:  The patient's family history includes Allergies in an other family member; Asthma in her cousin; Breast cancer in an other family member; Cervical cancer in an other family member; Clotting disorder in her mother; Heart disease in her brother and father; Hodgkin's lymphoma in her mother; Lung cancer in an other family member; Stomach cancer in an other family  member.   ROS:  Please see the history of present illness.      All other systems reviewed and negative.   PHYSICAL EXAM: VS:  BP 122/68  Pulse 63  Ht  (1.676 m)  Wt 188 lb (85.276 kg)  BMI 30.36 kg/m2 Well nourished, well developed, in no acute distress HEENT: normal Neck: no JVD Cardiac:  normal S1, S2; RRR; no murmur Lungs:  clear to auscultation bilaterally, no wheezing, rhonchi or rales Abd: soft, nontender, no hepatomegaly Ext: no edema Skin: warm and dry Neuro:  CNs 2-12 intact, no focal abnormalities noted  EKG:  NSR with no ST changes     ASSESSMENT AND PLAN:  1. Paroxysmal atrial tachycardia  - now with recurrent palpitations. Some feel like her PVC's but at other times it is more frequent.  When she gets the PVC's nothing helps but if her heart starts to race she can stop it with coughing. - check event monitor to assess for  arrhythmias.   - continue Bystolic - I have recommended that she decrease the amount of chocolate she is eating and avoid caffeine 2. PVC's - recurrent and symptomatic 3. Diastolic dysfunction 4. MVP with mild MR - recheck 2D echo to reassess - last echo 2011 5.  Left sided chest pressure with left arm pressure and numbness associated with SOB and dizziness - Stress myoview to assess for ischemia  Followup with me in 6 months if studies are normal  Signed, Armanda Magic, MD 07/16/2014 2:28 PM

## 2014-07-30 ENCOUNTER — Ambulatory Visit (HOSPITAL_COMMUNITY): Payer: Medicare Other | Attending: Internal Medicine | Admitting: Radiology

## 2014-07-30 ENCOUNTER — Ambulatory Visit (HOSPITAL_BASED_OUTPATIENT_CLINIC_OR_DEPARTMENT_OTHER): Payer: Medicare Other | Admitting: Radiology

## 2014-07-30 ENCOUNTER — Encounter (INDEPENDENT_AMBULATORY_CARE_PROVIDER_SITE_OTHER): Payer: Medicare Other

## 2014-07-30 ENCOUNTER — Encounter: Payer: Self-pay | Admitting: *Deleted

## 2014-07-30 VITALS — BP 130/78 | HR 66 | Ht 66.0 in | Wt 187.0 lb

## 2014-07-30 DIAGNOSIS — R42 Dizziness and giddiness: Secondary | ICD-10-CM

## 2014-07-30 DIAGNOSIS — I471 Supraventricular tachycardia: Secondary | ICD-10-CM

## 2014-07-30 DIAGNOSIS — Z87891 Personal history of nicotine dependence: Secondary | ICD-10-CM | POA: Insufficient documentation

## 2014-07-30 DIAGNOSIS — I341 Nonrheumatic mitral (valve) prolapse: Secondary | ICD-10-CM

## 2014-07-30 DIAGNOSIS — R079 Chest pain, unspecified: Secondary | ICD-10-CM

## 2014-07-30 DIAGNOSIS — R072 Precordial pain: Secondary | ICD-10-CM | POA: Insufficient documentation

## 2014-07-30 DIAGNOSIS — R0602 Shortness of breath: Secondary | ICD-10-CM

## 2014-07-30 DIAGNOSIS — I4949 Other premature depolarization: Secondary | ICD-10-CM

## 2014-07-30 DIAGNOSIS — R002 Palpitations: Secondary | ICD-10-CM

## 2014-07-30 MED ORDER — TECHNETIUM TC 99M SESTAMIBI GENERIC - CARDIOLITE
10.0000 | Freq: Once | INTRAVENOUS | Status: AC | PRN
  Administered 2014-07-30: 10 via INTRAVENOUS

## 2014-07-30 MED ORDER — TECHNETIUM TC 99M SESTAMIBI GENERIC - CARDIOLITE
30.0000 | Freq: Once | INTRAVENOUS | Status: AC | PRN
  Administered 2014-07-30: 30 via INTRAVENOUS

## 2014-07-30 NOTE — Progress Notes (Signed)
Patient ID: Brandi Miller, female   DOB: 01-26-1951, 63 y.o.   MRN: [id number] [employer] 30 day cardiac event monitor applied to patient.

## 2014-07-30 NOTE — Progress Notes (Signed)
[facility] SITE 3 NUCLEAR MED [address] [place], [place] [address] [phone]    Cardiology Nuclear Med Study  [name] is a 63 y.o. female     MRN : [id number]     DOB: March 03, 1951  Procedure Date: 07/30/2014  Nuclear Med Background Indication for Stress Test:  Evaluation for Ischemia History:  MPI 2011 (normal) EF 71%, NS atrial tachycardia, PVC's Cardiac Risk Factors: None  Symptoms:  Chest Pain (last date of chest discomfort was four weeks ago), Dizziness and Palpitations   Nuclear Pre-Procedure Caffeine/Decaff Intake:  None> 12 hrs NPO After: 8:30pm   Lungs:  clear O2 Sat: 95% on room air. IV 0.9% NS with Angio Cath:  22g  IV Site: R Forearm x 1, tolerated well IV Started by:  Irean Hong, RN  Chest Size (in):  40 Cup Size: C  Height:  (1.676 m)  Weight:  187 lb (84.823 kg)  BMI:  Body mass index is 30.2 kg/(m^2). Tech Comments:  Patient held Bystolic x 36 hrs. Patsy Edwards,RN.    Nuclear Med Study 1 or 2 day study: 1 day  Stress Test Type:  Stress  Reading MD: N/A  Order Authorizing Provider:  Armanda Magic, MD  Resting Radionuclide: Technetium 12m Sestamibi  Resting Radionuclide Dose: 11.0 mCi   Stress Radionuclide:  Technetium 24m Sestamibi  Stress Radionuclide Dose: 33.0 mCi           Stress Protocol Rest HR: 66 Stress HR: 134  Rest BP: 130/78 Stress BP: 214/81  Exercise Time (min): 8:15 METS: 10.1           Dose of Adenosine (mg):  n/a Dose of Lexiscan: n/a mg  Dose of Atropine (mg): n/a Dose of Dobutamine: n/a mcg/kg/min (at max HR)  Stress Test Technologist: Nelson Chimes, BS-ES  Nuclear Technologist:  Doyne Keel, CNMT     Rest Procedure:  Myocardial perfusion imaging was performed at rest 45 minutes following the intravenous administration of Technetium 49m Sestamibi. Rest ECG: NSR - Normal EKG  Stress Procedure:  The patient exercised on the treadmill utilizing the Bruce Protocol for 8:15 minutes. The patient  stopped due to fatigue, neck pressure and denied any chest pain.  Technetium 59m Sestamibi was injected at peak exercise and myocardial perfusion imaging was performed after a brief delay. Stress ECG: Insignificant upsloping ST segment depression.  QPS Raw Data Images:  Normal; no motion artifact; normal heart/lung ratio. Stress Images:  Normal homogeneous uptake in all areas of the myocardium. Rest Images:  Normal homogeneous uptake in all areas of the myocardium. Subtraction (SDS):  There is no evidence of scar or ischemia. Transient Ischemic Dilatation (Normal <1.22):  0.98 Lung/Heart Ratio (Normal <0.45):  0.36  Quantitative Gated Spect Images QGS EDV:  n/a QGS ESV:  n/a  Impression Exercise Capacity:  Good exercise capacity. BP Response:  Normal blood pressure response. Clinical Symptoms:  Neck pressure.  ECG Impression:  Insignificant upsloping ST segment depression. Comparison with Prior Nuclear Study: Prior study was normal.   Overall Impression:  Normal stress nuclear study.  Neck pressure with exercise and upsloping ST depression inferiorly and anterolaterally, but perfusion images were normal.   LV Ejection Fraction: Study not gated.  LV Wall Motion:  Study not gated  [employer] 07/30/2014

## 2014-07-30 NOTE — Progress Notes (Signed)
Echocardiogram performed.  

## 2014-08-04 ENCOUNTER — Encounter: Payer: Self-pay | Admitting: Cardiology

## 2014-09-02 ENCOUNTER — Telehealth: Payer: Self-pay | Admitting: Cardiology

## 2014-09-02 NOTE — Telephone Encounter (Signed)
Please let patient know that heart monitor showed sinus bradycardia as low as 55bpm and NSR with average HR 63bpm with rare PAC's which are benign

## 2014-09-02 NOTE — Telephone Encounter (Signed)
The patient is aware of her results. She states she is still having "PVC's" and sometimes will feel this for an extended period of time. I have advised her to please call if these get worse.

## 2014-09-23 ENCOUNTER — Other Ambulatory Visit: Payer: Self-pay

## 2014-09-23 DIAGNOSIS — Z1231 Encounter for screening mammogram for malignant neoplasm of breast: Secondary | ICD-10-CM

## 2014-11-27 ENCOUNTER — Ambulatory Visit
Admission: RE | Admit: 2014-11-27 | Discharge: 2014-11-27 | Disposition: A | Discharge status: Home / Self Care | Payer: Medicare Other | Source: Ambulatory Visit

## 2014-11-27 ENCOUNTER — Encounter (INDEPENDENT_AMBULATORY_CARE_PROVIDER_SITE_OTHER): Payer: Self-pay

## 2014-11-27 DIAGNOSIS — Z1231 Encounter for screening mammogram for malignant neoplasm of breast: Secondary | ICD-10-CM

## 2015-11-03 ENCOUNTER — Other Ambulatory Visit: Payer: Self-pay

## 2015-11-03 DIAGNOSIS — Z1231 Encounter for screening mammogram for malignant neoplasm of breast: Secondary | ICD-10-CM

## 2015-12-01 ENCOUNTER — Ambulatory Visit
Admission: RE | Admit: 2015-12-01 | Discharge: 2015-12-01 | Disposition: A | Discharge status: Home / Self Care | Payer: Medicare Other | Source: Ambulatory Visit

## 2015-12-01 DIAGNOSIS — Z1231 Encounter for screening mammogram for malignant neoplasm of breast: Secondary | ICD-10-CM

## 2016-12-05 ENCOUNTER — Other Ambulatory Visit: Payer: Self-pay | Admitting: Family Medicine

## 2016-12-05 DIAGNOSIS — Z1231 Encounter for screening mammogram for malignant neoplasm of breast: Secondary | ICD-10-CM

## 2017-03-20 ENCOUNTER — Ambulatory Visit
Admission: RE | Admit: 2017-03-20 | Discharge: 2017-03-20 | Disposition: A | Discharge status: Home / Self Care | Payer: Medicare Other | Source: Ambulatory Visit | Attending: Family Medicine | Admitting: Family Medicine

## 2017-03-20 DIAGNOSIS — Z1231 Encounter for screening mammogram for malignant neoplasm of breast: Secondary | ICD-10-CM
